# Patient Record
Sex: Male | Born: 1935 | Race: Black or African American | Hispanic: No | Marital: Married
Health system: Southern US, Community
[De-identification: ages and names within clinical notes are randomized; demographics above are authoritative.]

---

## 1999-08-18 ENCOUNTER — Encounter: Payer: Self-pay | Admitting: Cardiology

## 1999-08-18 ENCOUNTER — Ambulatory Visit (HOSPITAL_COMMUNITY): Admission: RE | Admit: 1999-08-18 | Discharge: 1999-08-18 | Payer: Self-pay | Admitting: Cardiology

## 1999-12-12 ENCOUNTER — Inpatient Hospital Stay (HOSPITAL_COMMUNITY): Admission: EM | Admit: 1999-12-12 | Discharge: 1999-12-15 | Payer: Self-pay | Admitting: Emergency Medicine

## 1999-12-12 ENCOUNTER — Encounter: Payer: Self-pay | Admitting: Emergency Medicine

## 1999-12-12 ENCOUNTER — Encounter: Payer: Self-pay | Admitting: Neurology

## 1999-12-14 ENCOUNTER — Encounter: Payer: Self-pay | Admitting: Neurology

## 1999-12-15 ENCOUNTER — Inpatient Hospital Stay (HOSPITAL_COMMUNITY)
Admission: EM | Admit: 1999-12-15 | Discharge: 2000-01-17 | Payer: Self-pay | Admitting: Physical Medicine & Rehabilitation

## 1999-12-26 ENCOUNTER — Encounter: Payer: Self-pay | Admitting: Physical Medicine & Rehabilitation

## 2000-01-08 ENCOUNTER — Encounter: Payer: Self-pay | Admitting: Physical Medicine & Rehabilitation

## 2000-01-18 ENCOUNTER — Encounter: Admission: RE | Admit: 2000-01-18 | Discharge: 2000-04-17 | Payer: Self-pay | Admitting: Internal Medicine

## 2000-11-26 ENCOUNTER — Encounter: Payer: Self-pay | Admitting: Emergency Medicine

## 2000-11-26 ENCOUNTER — Inpatient Hospital Stay (HOSPITAL_COMMUNITY): Admission: EM | Admit: 2000-11-26 | Discharge: 2000-12-02 | Payer: Self-pay

## 2000-11-27 ENCOUNTER — Encounter: Payer: Self-pay | Admitting: Neurosurgery

## 2001-01-20 ENCOUNTER — Observation Stay (HOSPITAL_COMMUNITY): Admission: EM | Admit: 2001-01-20 | Discharge: 2001-01-21 | Payer: Self-pay | Admitting: Emergency Medicine

## 2001-01-20 ENCOUNTER — Encounter: Payer: Self-pay | Admitting: Emergency Medicine

## 2001-01-26 ENCOUNTER — Encounter: Payer: Self-pay | Admitting: *Deleted

## 2001-01-26 ENCOUNTER — Emergency Department (HOSPITAL_COMMUNITY): Admission: EM | Admit: 2001-01-26 | Discharge: 2001-01-26 | Payer: Self-pay | Admitting: Emergency Medicine

## 2001-08-25 ENCOUNTER — Emergency Department (HOSPITAL_COMMUNITY): Admission: EM | Admit: 2001-08-25 | Discharge: 2001-08-26 | Payer: Self-pay | Admitting: Emergency Medicine

## 2001-08-25 ENCOUNTER — Encounter: Payer: Self-pay | Admitting: Emergency Medicine

## 2002-02-16 ENCOUNTER — Encounter: Payer: Self-pay | Admitting: Emergency Medicine

## 2002-02-16 ENCOUNTER — Emergency Department (HOSPITAL_COMMUNITY): Admission: EM | Admit: 2002-02-16 | Discharge: 2002-02-16 | Payer: Self-pay | Admitting: Emergency Medicine

## 2002-03-12 ENCOUNTER — Emergency Department (HOSPITAL_COMMUNITY): Admission: EM | Admit: 2002-03-12 | Discharge: 2002-03-13 | Payer: Self-pay

## 2002-04-06 ENCOUNTER — Encounter: Payer: Self-pay | Admitting: Emergency Medicine

## 2002-04-06 ENCOUNTER — Emergency Department (HOSPITAL_COMMUNITY): Admission: EM | Admit: 2002-04-06 | Discharge: 2002-04-07 | Payer: Self-pay | Admitting: Emergency Medicine

## 2003-08-21 ENCOUNTER — Inpatient Hospital Stay (HOSPITAL_COMMUNITY): Admission: EM | Admit: 2003-08-21 | Discharge: 2003-09-01 | Payer: Self-pay | Admitting: Emergency Medicine

## 2006-01-16 IMAGING — CT CT ABDOMEN W/ CM
1 series · 15 of 32 positions shown, 19 images · IV contrast (GASTROGRAFIN & 75 M L OMNI 300)
Comparison: none

CLINICAL DATA: Gross hematuria/elevated amylase/vomiting.
 CT ABDOMEN AND PELVIS WITH CONTRAST
TECHNIQUE: Multidetector helical imaging carried out through the abdomen and pelvis utilizing oral and IV contrast (75 cc Omnipaque 300).  Routine delayed images through the kidneys were obtained.  Because we saw no contrast excreted into the collecting system, several additional delayed sets of images were obtained, [DATE] hour postinjection of contrast. 
 ABDOMEN WITH CONTRAST 
 No focal lesions of the liver or spleen.  The pancreas and adrenals are normal.  No calcified gallstones or bile duct dilatation.  
 There is a considerable amount of perinephric stranding and stranding around the ureters and generally into the retroperitoneum anterior to the great vessels.  On the early delayed images of the kidneys, one sees no contrast in the collecting system, but there is a good nephrogram effect.  On serial delayed images through the kidneys, especially at 1 hour, one begins to see some early caliceal filling.  The calices are mildly dilated.  The upper ureters are also mildly dilated.  
 No adenopathy or ascites.

[Series 2: abd pelvis · axial · 0.79mm/px · z∈[-365,-36]mm · 15 of 321 slices shown, 19 images]
[im 21/321  soft-tissue]
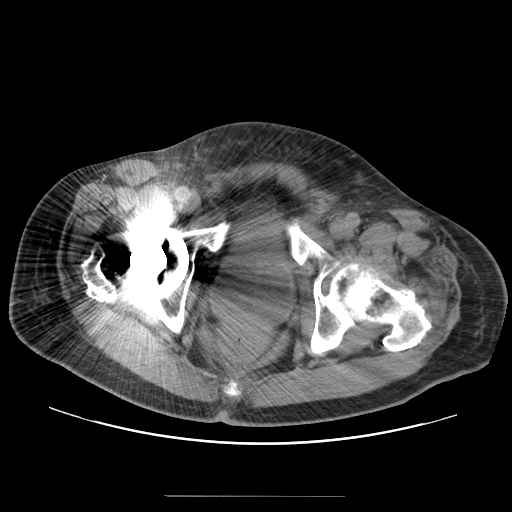
[im 21/321  bone]
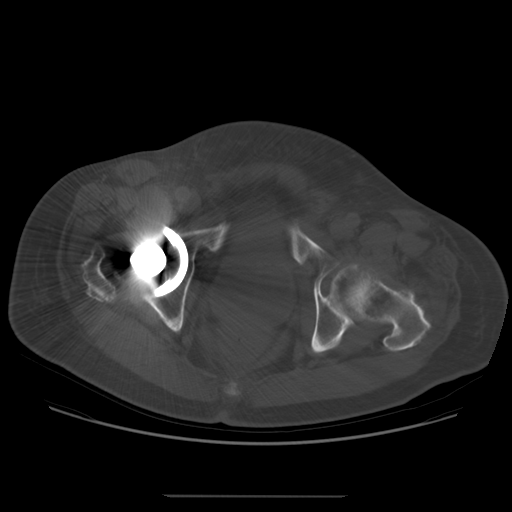
[im 42/321  soft-tissue]
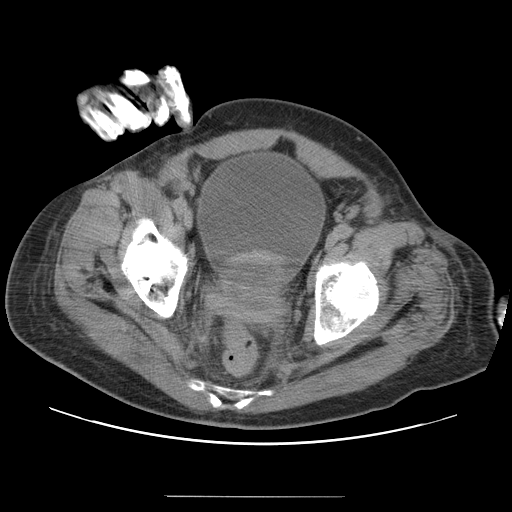
[im 62/321  soft-tissue]
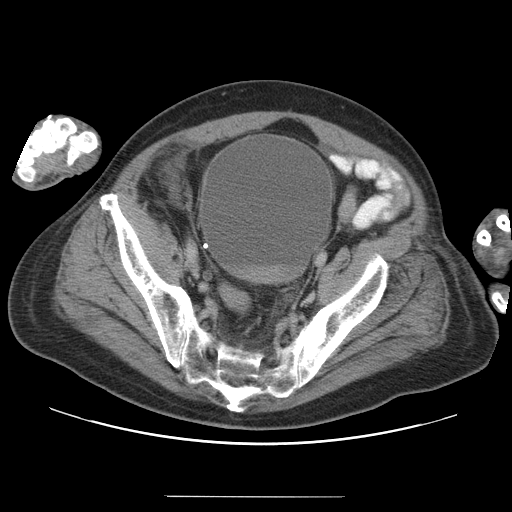
[im 93/321  soft-tissue]
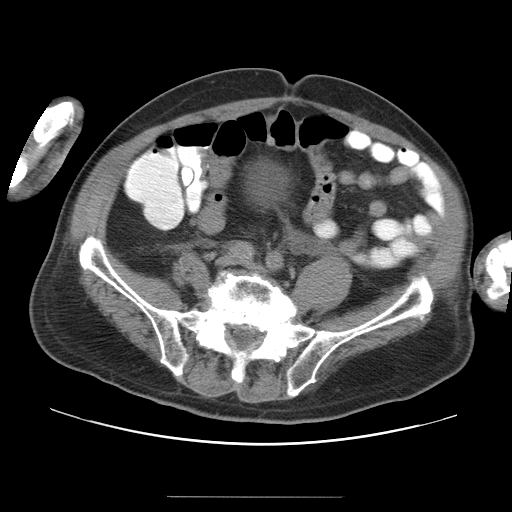
[im 114/321  soft-tissue]
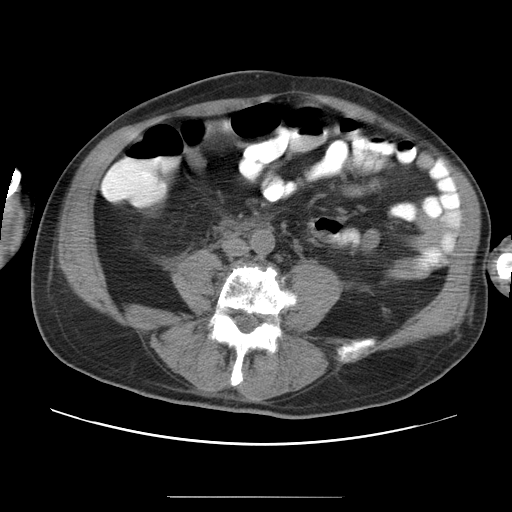
[im 135/321  soft-tissue]
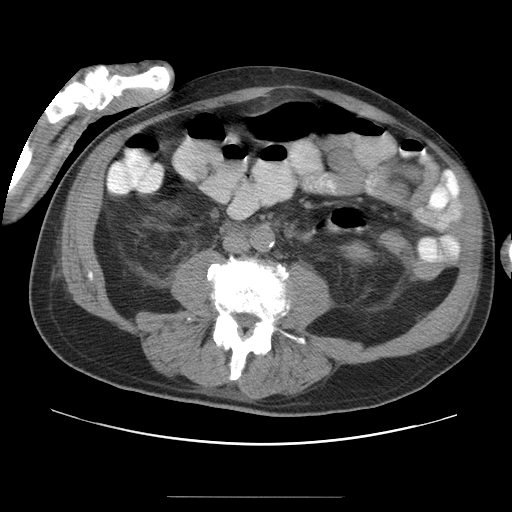
[im 166/321  soft-tissue]
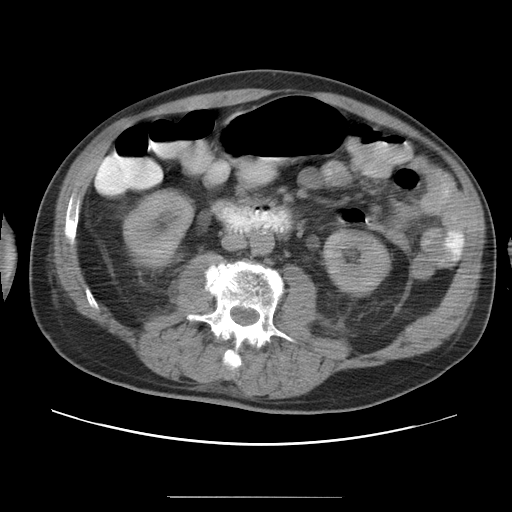
[im 186/321  soft-tissue]
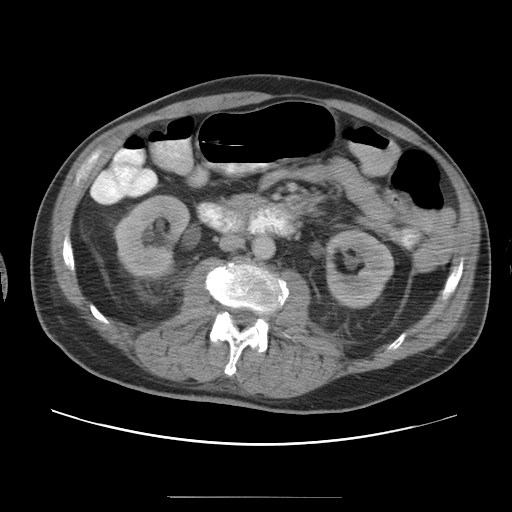
[im 207/321  soft-tissue]
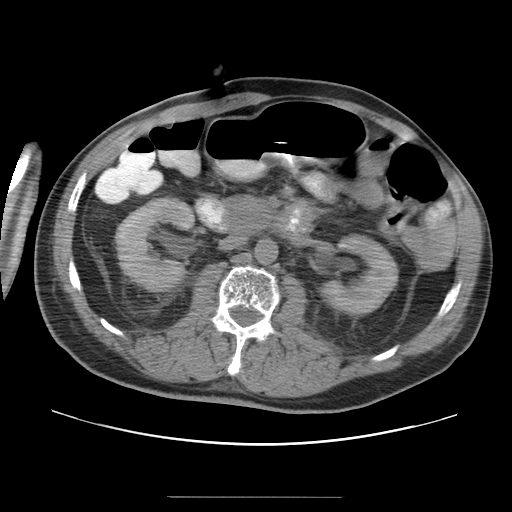
[im 207/321  bone]
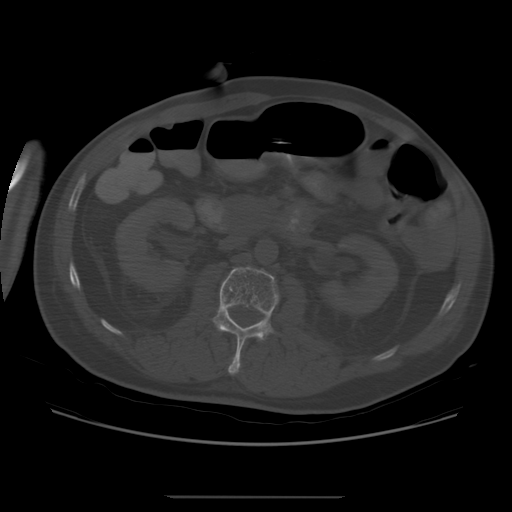
[im 228/321  soft-tissue]
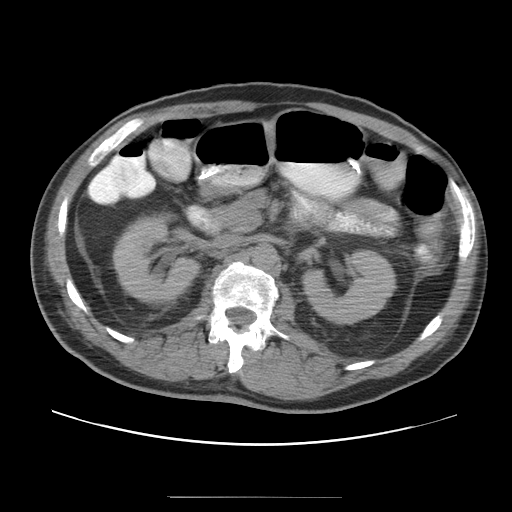
[im 259/321  soft-tissue]
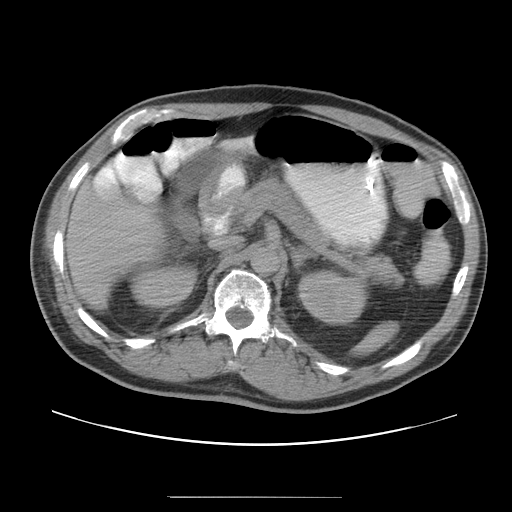
[im 279/321  soft-tissue]
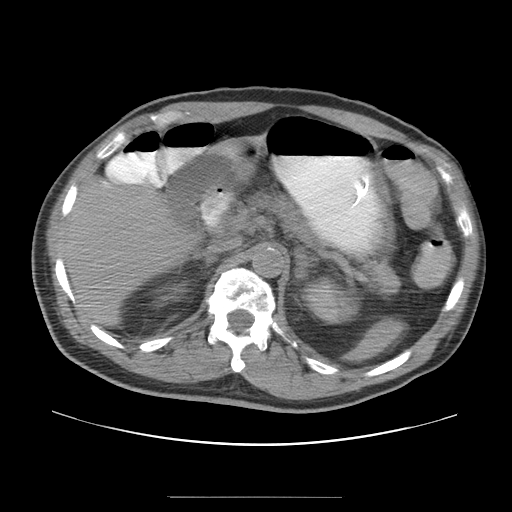
[im 279/321  lung]
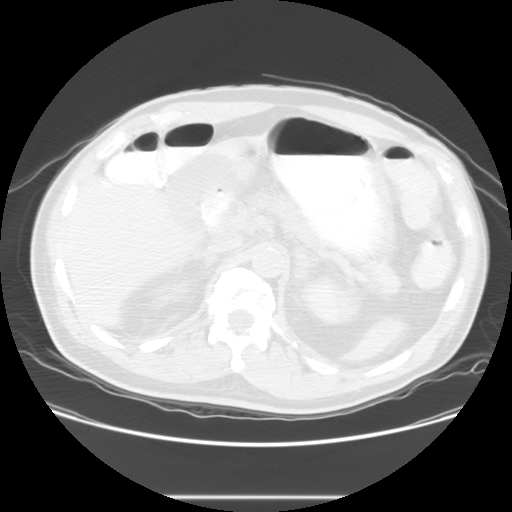
[im 290/321  lung]
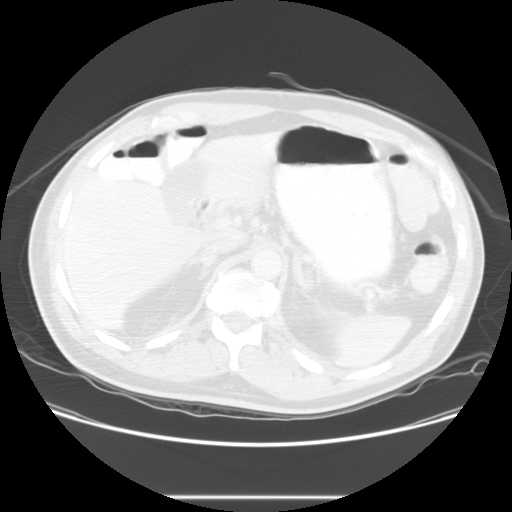
[im 300/321  soft-tissue]
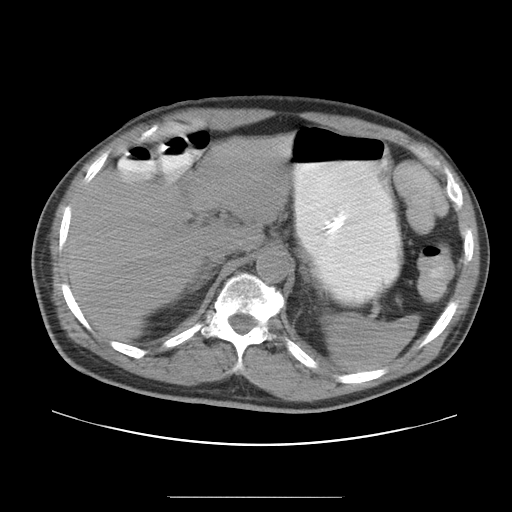
[im 300/321  lung]
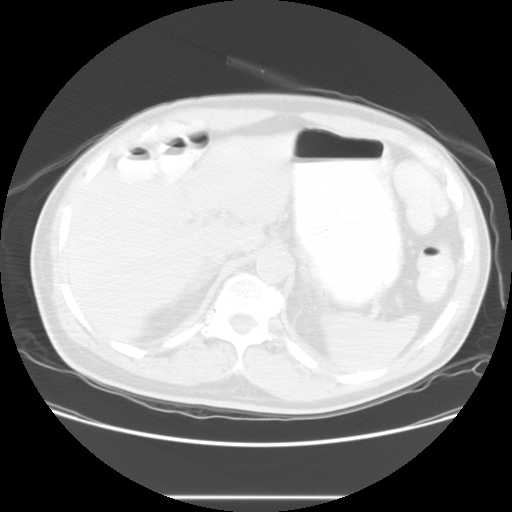
[im 310/321  lung]
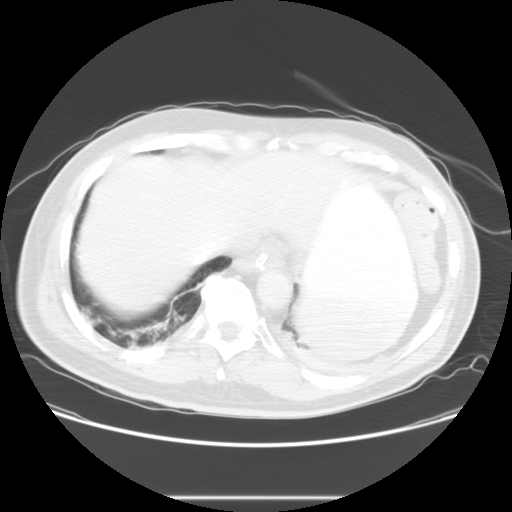

[15 of 32 positions shown; findings below may reference images not displayed]

IMPRESSION: Diminished excretion of contrast into the renal collecting systems bilaterally with fullness of the calices, renal pelves, and upper ureters.  There is also perinephric stranding and generalized stranding in the retroperitoneum.  There is suggestive of acute urinary tract obstruction.  
 No other findings that are specifically abnormal.
 PELVIS WITH CONTRAST 
 Marked enlargement of the prostate.  Moderate distention of the bladder.  On the delayed images, there is some high-density material layering in the dependent portion of the bladder.  This may be blood or it may be a small amount of contrast.  The lower ureters, especially the right, are also somewhat dilated.  The most likely explanation is acute urinary tract obstruction secondary to an enlarged prostate.  Dr. Mawie Kd was made aware of this finding.
IMPRESSION: Enlarged prostate, probably causing bilateral ureteral obstruction.  
 Blood or possibly contrast layering in the dependent portion of the bladder on delayed images.

## 2006-01-20 IMAGING — CR DG ABDOMEN 2V
2 series · 2 of 2 positions shown · non-contrast
Comparison: none

CLINICAL DATA: Vomiting. 
RENAL ULTRASOUND
The right kidney measures 11.2 cm and the left kidney measures 11.2 cm.  Both kidneys have a normal cortical medullary parenchyma.  Negative for pelvocaliectasis or perinephric fluid collections.  Catheter is seen within the bladder which is decompressed.  
IMPRESSION
The kidneys are normal by ultrasound.
TWO VIEW ABDOMEN INCLUDES SUPINE AND LEFT LATERAL DECUBITUS VIEW
NG tube  extends into the stomach which is decompressed.  Nonobstructive bowel gas.  No free air. 
Nonobstructive bowel gas.

[view not recorded (1 of 2)]
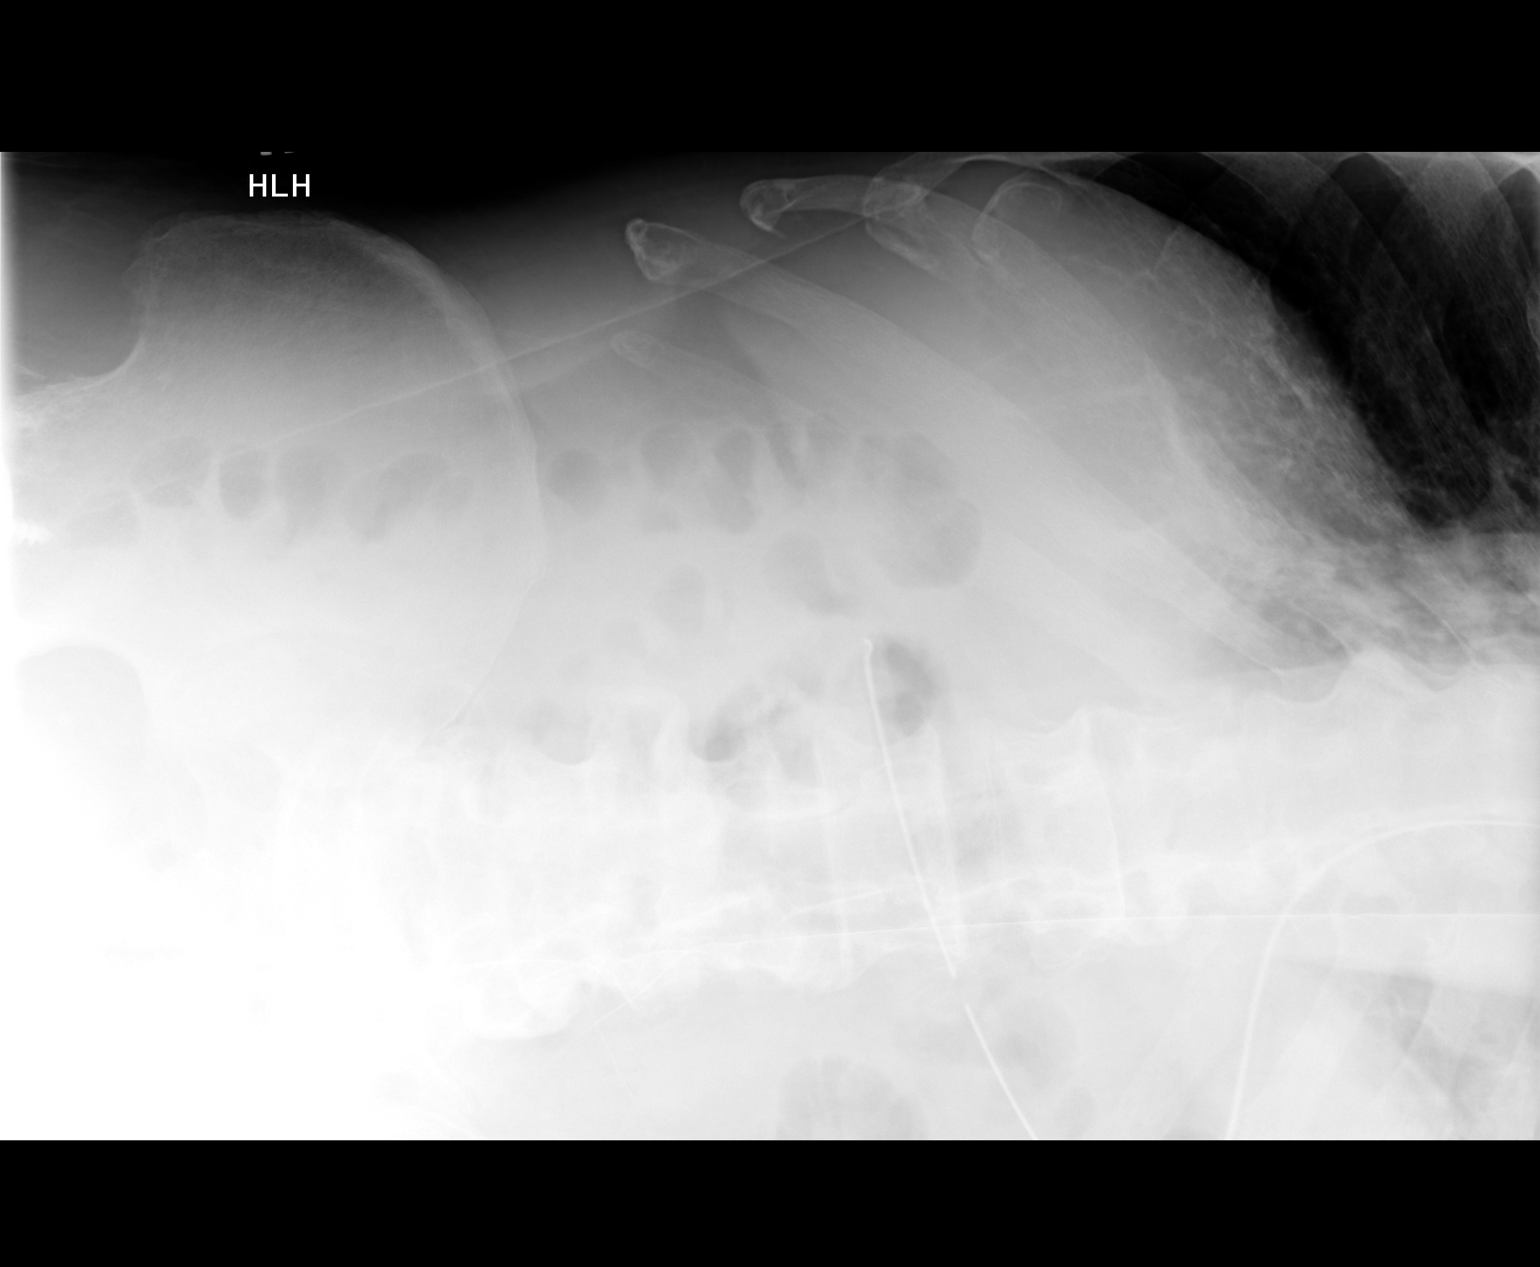

[view not recorded (2 of 2)]
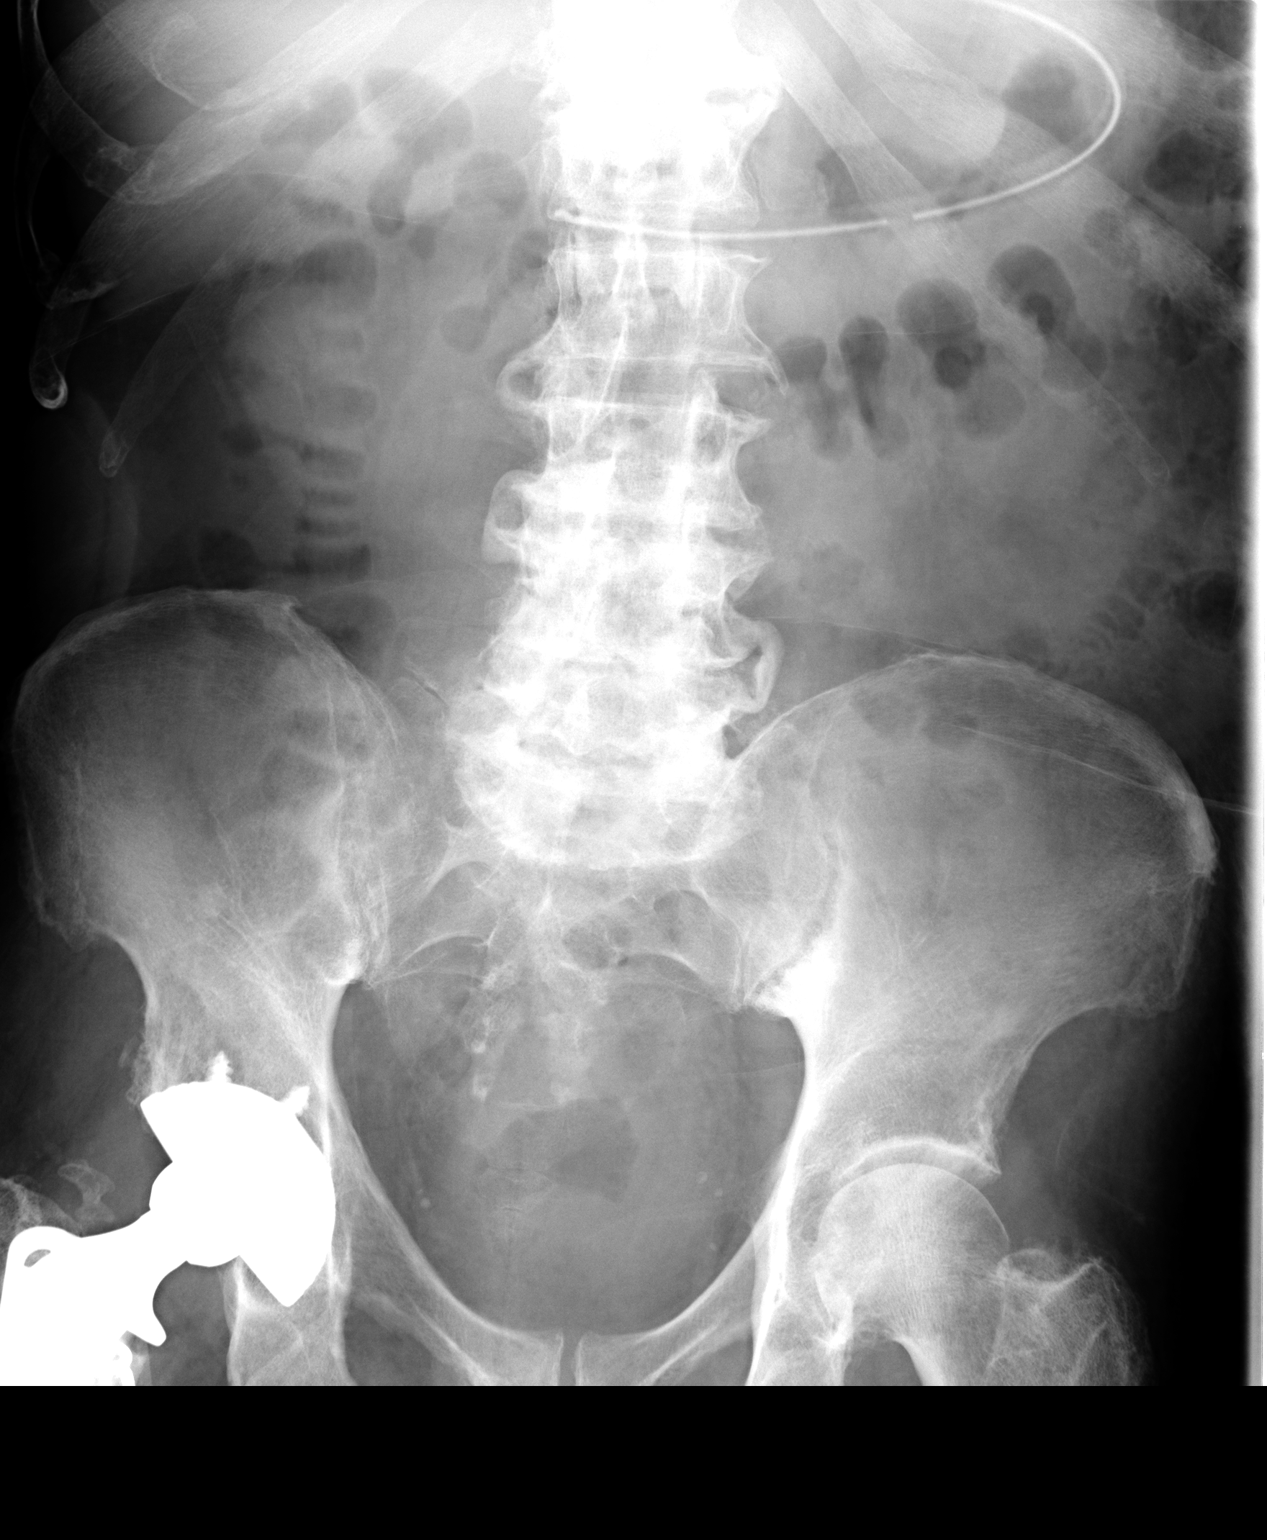

[2 of 2 positions shown; findings below may reference images not displayed]

## 2006-01-22 IMAGING — CR DG CHEST 1V PORT
1 series · 1 of 1 positions shown · non-contrast
Comparison: 08/30/2003.

CLINICAL DATA: PICC line placement.  Followup aeration.  
 PORTABLE CHEST ? 09/01/2003 TAKEN AT 4008

[view not recorded]
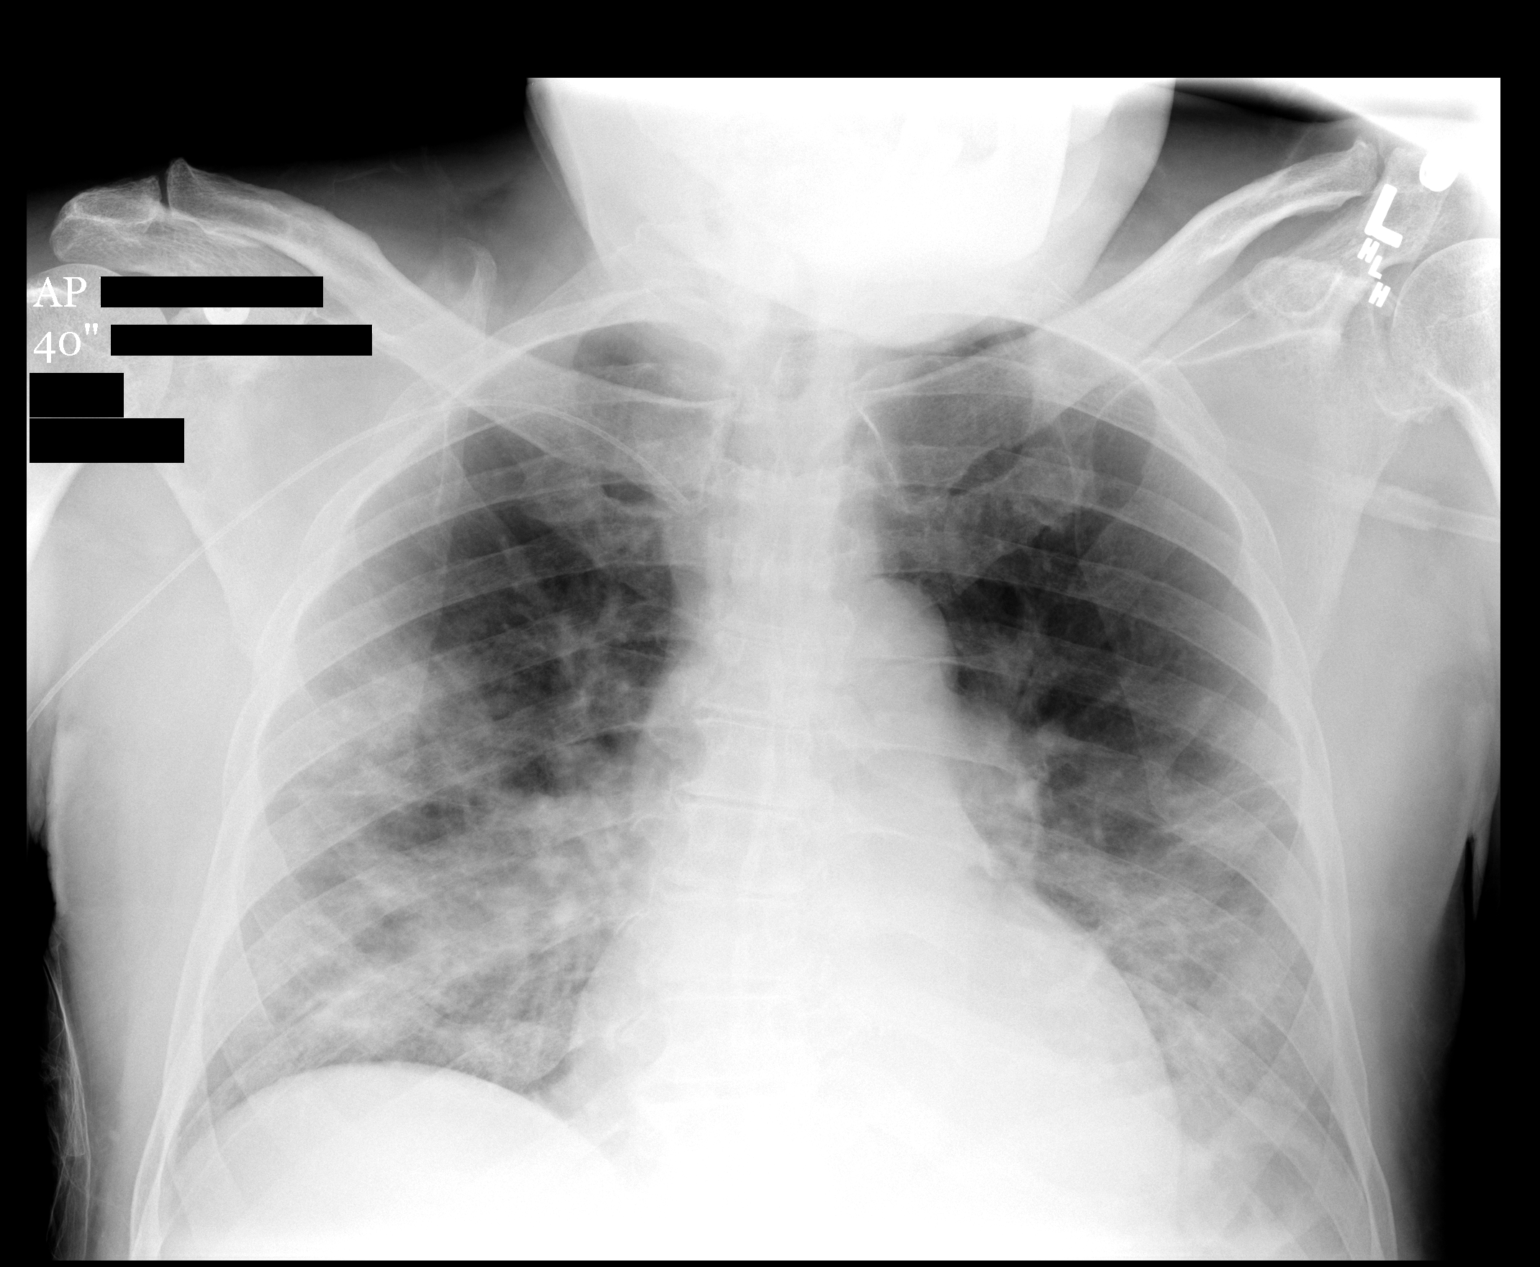

[1 of 1 positions shown; findings below may reference images not displayed]

A PICC line has been placed via right upper extremity route with the tip of the catheter in the region of the junction of the right brachiocephalic vein and superior vena cava.  Diffuse bilateral airspace disease in a perihilar and bibasilar distribution and this has not significantly changed intervally.  The NG tube has been removed. 
 IMPRESSION
 PICC line tip in the region of the junction of the right brachiocephalic vein and superior vena cava.      
 Diffuse bilateral airspace disease without significant change.

## 2020-04-12 ENCOUNTER — Ambulatory Visit: Payer: Self-pay | Attending: Internal Medicine

## 2020-04-12 ENCOUNTER — Other Ambulatory Visit: Payer: Self-pay

## 2020-04-12 DIAGNOSIS — Z23 Encounter for immunization: Secondary | ICD-10-CM

## 2020-04-12 NOTE — Progress Notes (Signed)
   [facility]  Name:  Micheal Adams    MRN: [id number] DOB: March 04, 1921  04/12/2020  Mr. Micheal Adams was observed post Covid-19 immunization for 15 minutes without incident. He was provided with Vaccine Information Sheet and instruction to access the V-Safe system.   Mr. Micheal Adams was instructed to call [phone] with any severe reactions post vaccine: Marland Kitchen Difficulty breathing  . Swelling of face and throat  . A fast heartbeat  . A bad rash all over body  . Dizziness and weakness
